# Patient Record
Sex: Female | Born: 1960 | Race: White | Hispanic: No | Smoking: Former smoker
Health system: Southern US, Community
[De-identification: ages and names within clinical notes are randomized; demographics above are authoritative.]

## PROBLEM LIST (undated history)

## (undated) DIAGNOSIS — S62101A Fracture of unspecified carpal bone, right wrist, initial encounter for closed fracture: Secondary | ICD-10-CM

## (undated) HISTORY — PX: BILATERAL CARPAL TUNNEL RELEASE: SHX6508

## (undated) HISTORY — PX: KNEE ARTHROSCOPY: SHX127

## (undated) HISTORY — PX: DILATION AND CURETTAGE OF UTERUS: SHX78

---

## 2013-07-01 ENCOUNTER — Emergency Department: Payer: Self-pay | Admitting: Emergency Medicine

## 2013-07-01 LAB — COMPREHENSIVE METABOLIC PANEL
Albumin: 3.5 g/dL (ref 3.4–5.0)
Anion Gap: 5 — ABNORMAL LOW (ref 7–16)
BUN: 12 mg/dL (ref 7–18)
Calcium, Total: 8.7 mg/dL (ref 8.5–10.1)
Co2: 28 mmol/L (ref 21–32)
Creatinine: 0.67 mg/dL (ref 0.60–1.30)
EGFR (African American): >60
EGFR (Non-African Amer.): >60
Osmolality: 273 (ref 275–301)
Total Protein: 6.9 g/dL (ref 6.4–8.2)

## 2013-07-01 LAB — CBC
HCT: 38.7 % (ref 35.0–47.0)
MCHC: 35.3 g/dL (ref 32.0–36.0)
MCV: 92 fL (ref 80–100)
RBC: 4.2 10*6/uL (ref 3.80–5.20)
RDW: 12.5 % (ref 11.5–14.5)
WBC: 4.6 10*3/uL (ref 3.6–11.0)

## 2015-03-18 IMAGING — CR DG CHEST 2V
1 series · 2 of 2 positions shown · non-contrast
Comparison: none

REASON FOR EXAM: Cold chills, abnormal previous CXR
COMMENTS:

[Series 1: pa · 0.17mm/px · 2 of 2 slices shown]
[im 1/2]
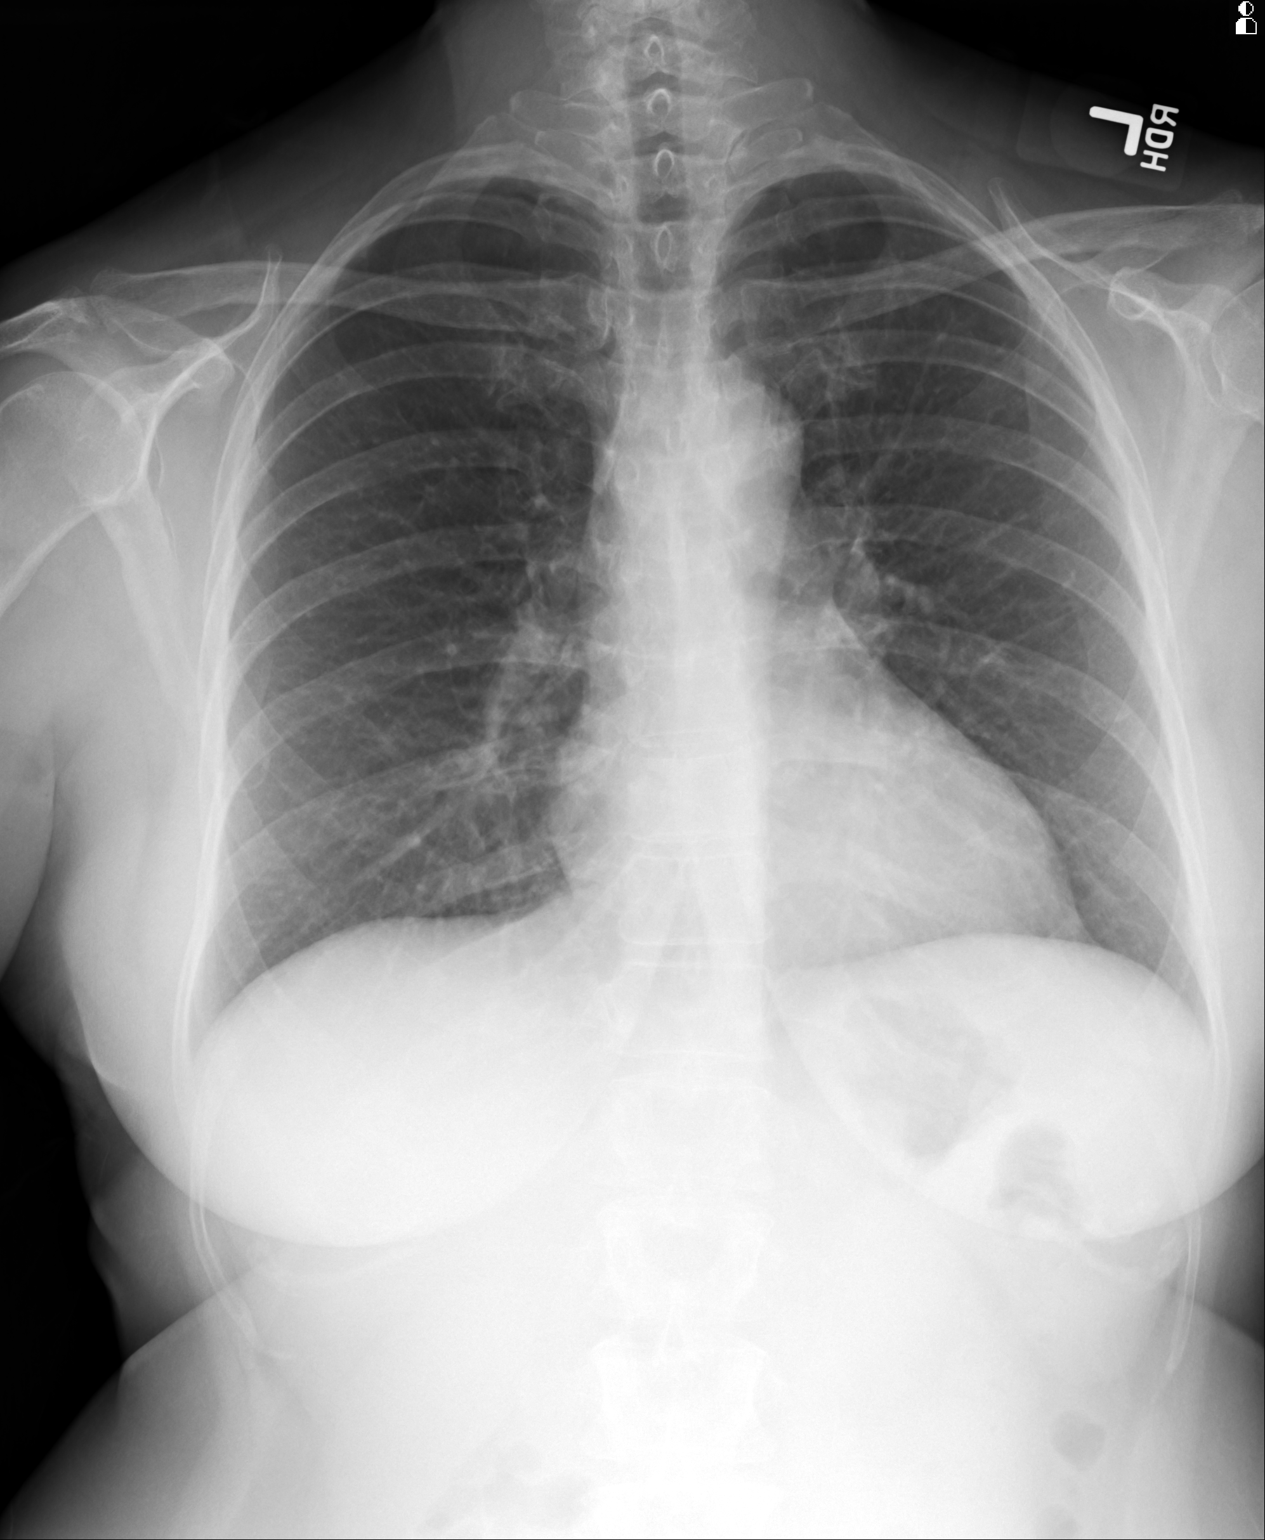
[im 2/2]
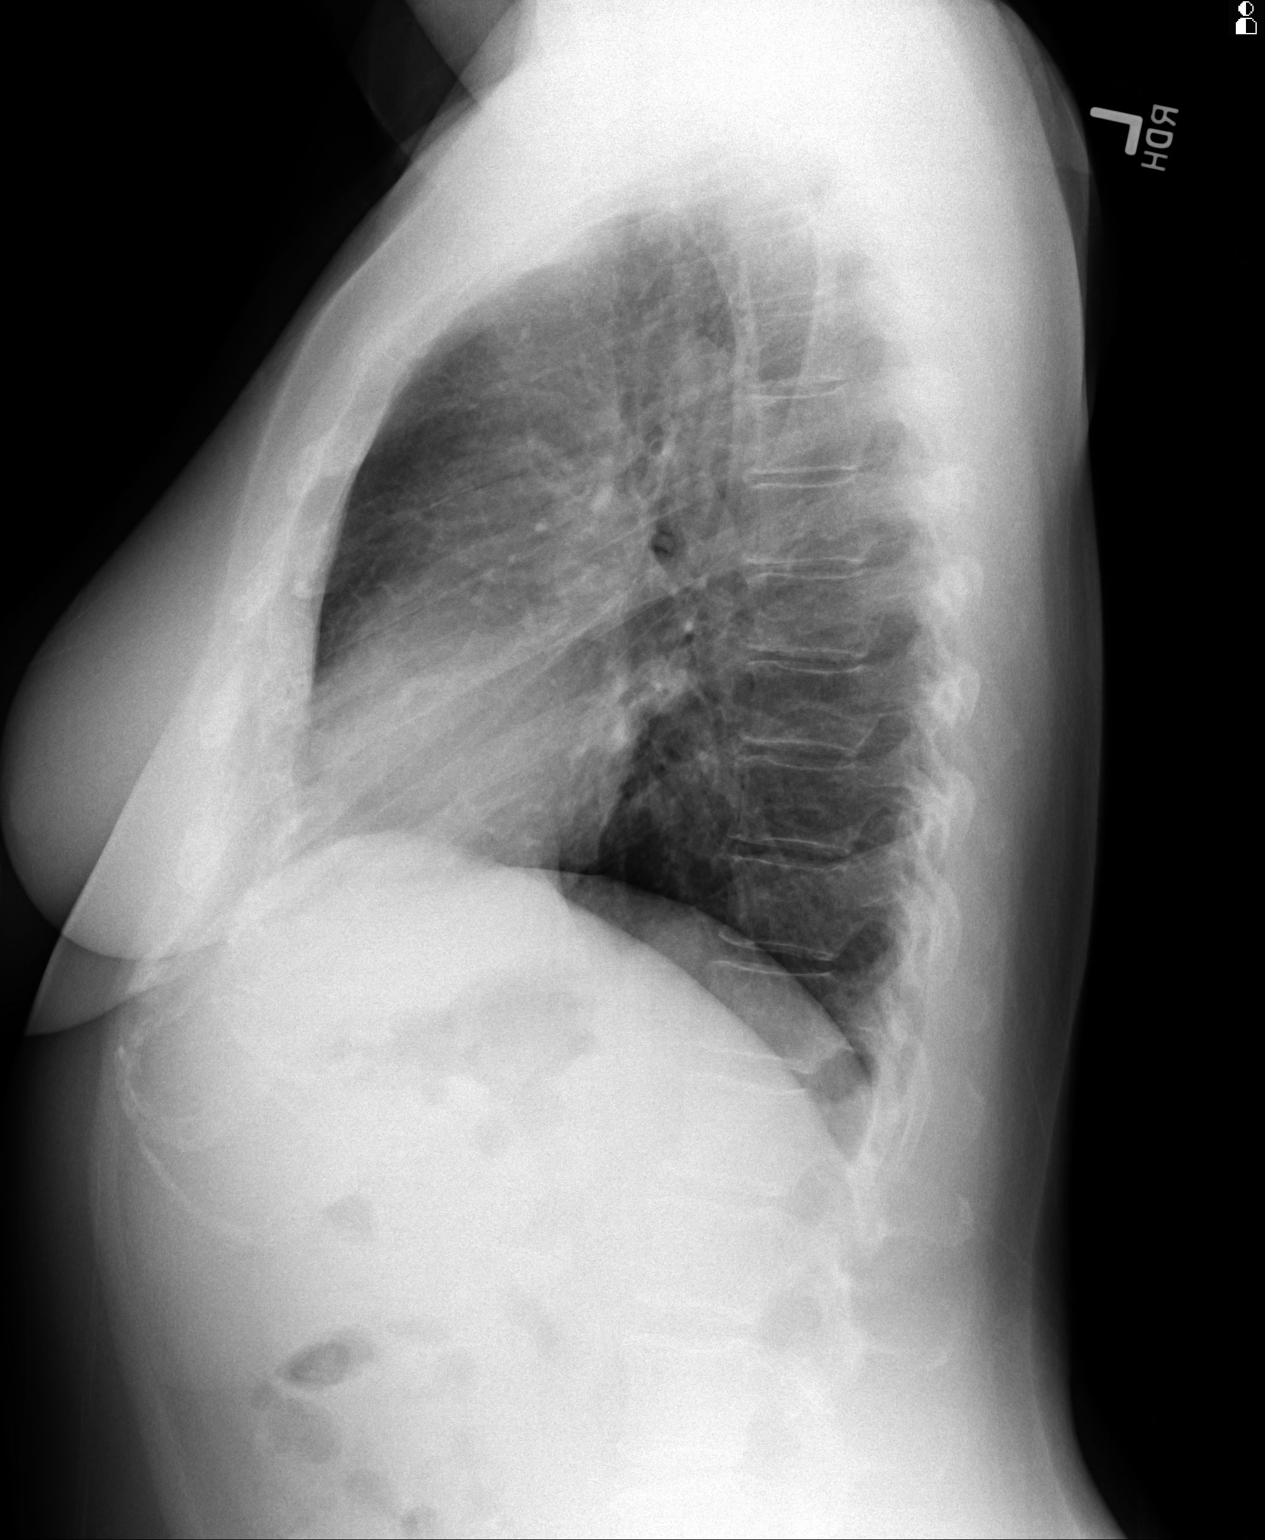

[2 of 2 positions shown; findings below may reference images not displayed]

PROCEDURE:     DXR - DXR CHEST PA (OR AP) AND LATERAL  - July 01, 2013  [DATE]

RESULT:     The lungs are adequately inflated and clear. The cardiac
silhouette is normal in size. There is no pulmonary vascular congestion. The
mediastinum is normal in width. There is no pleural effusion or
pneumothorax. The bony thorax exhibits no acute abnormality.
IMPRESSION: There is no evidence of pneumonia nor other acute
cardiopulmonary abnormality.

[REDACTED]

## 2015-06-04 ENCOUNTER — Encounter (HOSPITAL_BASED_OUTPATIENT_CLINIC_OR_DEPARTMENT_OTHER): Payer: Self-pay | Admitting: *Deleted

## 2015-06-04 NOTE — H&P (Signed)
  PREOPERATIVE H&P  Chief Complaint: UNSPECIFIED FRACTURE OF THE LOWER END OF UNSPECIFIED RADIUS INITIAL ENCOUNTER FOR CLOSED FRACTURE   HPI: Pamela York is a 54 y.o. female who presents for preoperative history and physical with a diagnosis of UNSPECIFIED FRACTURE OF THE LOWER END OF UNSPECIFIED RADIUS INITIAL ENCOUNTER FOR CLOSED FRACTURE . Symptoms are rated as moderate to severe, and have been worsening.  This is significantly impairing activities of daily living.  She has elected for surgical management.   Past Medical History  Diagnosis Date  . Wrist fracture, right    Past Surgical History  Procedure Laterality Date  . Dilation and curettage of uterus    . Bilateral carpal tunnel release Bilateral   . Knee arthroscopy Right    Social History   Social History  . Marital Status: Divorced    Spouse Name: N/A  . Number of Children: N/A  . Years of Education: N/A   Social History Main Topics  . Smoking status: Former Games developer  . Smokeless tobacco: None  . Alcohol Use: Yes     Comment: social  . Drug Use: No  . Sexual Activity: Not Asked   Other Topics Concern  . None   Social History Narrative  . None   History reviewed. No pertinent family history. Allergies  Allergen Reactions  . Penicillins Rash   Prior to Admission medications   Medication Sig Start Date End Date Taking? Authorizing Provider  HYDROcodone-acetaminophen (NORCO/VICODIN) 5-325 MG per tablet Take 1 tablet by mouth every 6 (six) hours as needed for moderate pain.   Yes Historical Provider, MD     Positive ROS: All other systems have been reviewed and were otherwise negative with the exception of those mentioned in the HPI and as above.  Physical Exam: General: Alert, no acute distress Cardiovascular: No pedal edema Respiratory: No cyanosis, no use of accessory musculature GI: No organomegaly, abdomen is soft and non-tender Skin: No lesions in the area of chief complaint Neurologic:  Sensation intact distally Psychiatric: Patient is competent for consent with normal mood and affect Lymphatic: No axillary or cervical lymphadenopathy  MUSCULOSKELETAL: Right wrist is swollen but no erythema or warmth.  Tender to palpation over the distal radius fracture site.  Limited ROM due to pain.  Sensation intact with 2+ distal pulses.   Assessment: UNSPECIFIED FRACTURE OF THE LOWER END OF UNSPECIFIED RADIUS INITIAL ENCOUNTER FOR CLOSED FRACTURE   Plan: Plan for Procedure(s): OPEN REDUCTION INTERNAL FIXATION (ORIF) RIGHT WRIST   The risks benefits and alternatives were discussed with the patient including but not limited to the risks of nonoperative treatment, versus surgical intervention including infection, bleeding, nerve injury,  blood clots, cardiopulmonary complications, morbidity, mortality, among others, and they were willing to proceed.   Lynann Bologna, PA-C  06/04/2015 5:29 PM

## 2015-06-06 ENCOUNTER — Encounter (HOSPITAL_BASED_OUTPATIENT_CLINIC_OR_DEPARTMENT_OTHER): Payer: Self-pay | Admitting: *Deleted

## 2015-06-06 ENCOUNTER — Ambulatory Visit (HOSPITAL_BASED_OUTPATIENT_CLINIC_OR_DEPARTMENT_OTHER): Payer: BLUE CROSS/BLUE SHIELD | Admitting: Anesthesiology

## 2015-06-06 ENCOUNTER — Encounter (HOSPITAL_BASED_OUTPATIENT_CLINIC_OR_DEPARTMENT_OTHER)
Admission: RE | Disposition: A | Discharge status: Home / Self Care | Payer: Self-pay | Source: Ambulatory Visit | Attending: Orthopedic Surgery

## 2015-06-06 ENCOUNTER — Ambulatory Visit (HOSPITAL_BASED_OUTPATIENT_CLINIC_OR_DEPARTMENT_OTHER)
Admission: RE | Admit: 2015-06-06 | Discharge: 2015-06-06 | Disposition: A | Discharge status: Home / Self Care | Payer: BLUE CROSS/BLUE SHIELD | Source: Ambulatory Visit | Attending: Orthopedic Surgery | Admitting: Orthopedic Surgery

## 2015-06-06 DIAGNOSIS — Z87891 Personal history of nicotine dependence: Secondary | ICD-10-CM | POA: Insufficient documentation

## 2015-06-06 DIAGNOSIS — Z880 Allergy status to penicillin: Secondary | ICD-10-CM | POA: Diagnosis not present

## 2015-06-06 DIAGNOSIS — Y9289 Other specified places as the place of occurrence of the external cause: Secondary | ICD-10-CM | POA: Insufficient documentation

## 2015-06-06 DIAGNOSIS — X58XXXA Exposure to other specified factors, initial encounter: Secondary | ICD-10-CM | POA: Diagnosis not present

## 2015-06-06 DIAGNOSIS — S52591A Other fractures of lower end of right radius, initial encounter for closed fracture: Secondary | ICD-10-CM | POA: Diagnosis not present

## 2015-06-06 DIAGNOSIS — Y9389 Activity, other specified: Secondary | ICD-10-CM | POA: Diagnosis not present

## 2015-06-06 DIAGNOSIS — Y998 Other external cause status: Secondary | ICD-10-CM | POA: Diagnosis not present

## 2015-06-06 HISTORY — PX: OPEN REDUCTION INTERNAL FIXATION (ORIF) DISTAL RADIAL FRACTURE: SHX5989

## 2015-06-06 HISTORY — DX: Fracture of unspecified carpal bone, right wrist, initial encounter for closed fracture: S62.101A

## 2015-06-06 SURGERY — OPEN REDUCTION INTERNAL FIXATION (ORIF) DISTAL RADIUS FRACTURE
Anesthesia: Regional | Site: Wrist | Laterality: Right

## 2015-06-06 MED ORDER — DEXAMETHASONE SODIUM PHOSPHATE 10 MG/ML IJ SOLN
INTRAMUSCULAR | Status: AC
  Filled 2015-06-06: qty 1

## 2015-06-06 MED ORDER — LIDOCAINE HCL (CARDIAC) 20 MG/ML IV SOLN
INTRAVENOUS | Status: AC
  Filled 2015-06-06: qty 5

## 2015-06-06 MED ORDER — FENTANYL CITRATE (PF) 100 MCG/2ML IJ SOLN
INTRAMUSCULAR | Status: AC
  Filled 2015-06-06: qty 2

## 2015-06-06 MED ORDER — DOCUSATE SODIUM 100 MG PO CAPS: 100.0000 mg | ORAL_CAPSULE | Freq: Two times a day (BID) | ORAL | Status: AC

## 2015-06-06 MED ORDER — CHLORHEXIDINE GLUCONATE 4 % EX LIQD: 60.0000 mL | Freq: Once | CUTANEOUS | Status: DC

## 2015-06-06 MED ORDER — MIDAZOLAM HCL 2 MG/2ML IJ SOLN
INTRAMUSCULAR | Status: AC
  Filled 2015-06-06: qty 2

## 2015-06-06 MED ORDER — OXYCODONE-ACETAMINOPHEN 5-325 MG PO TABS: 1.0000 | ORAL_TABLET | ORAL | Status: AC | PRN

## 2015-06-06 MED ORDER — PROPOFOL 10 MG/ML IV BOLUS
INTRAVENOUS | Status: DC | PRN
  Administered 2015-06-06: 200 mg via INTRAVENOUS

## 2015-06-06 MED ORDER — GLYCOPYRROLATE 0.2 MG/ML IJ SOLN: 0.2000 mg | Freq: Once | INTRAMUSCULAR | Status: DC | PRN

## 2015-06-06 MED ORDER — PROPOFOL 10 MG/ML IV BOLUS
INTRAVENOUS | Status: AC
  Filled 2015-06-06: qty 20

## 2015-06-06 MED ORDER — SCOPOLAMINE 1 MG/3DAYS TD PT72: 1.0000 | MEDICATED_PATCH | Freq: Once | TRANSDERMAL | Status: DC | PRN

## 2015-06-06 MED ORDER — 0.9 % SODIUM CHLORIDE (POUR BTL) OPTIME
TOPICAL | Status: DC | PRN
  Administered 2015-06-06: 300 mL

## 2015-06-06 MED ORDER — ACETAMINOPHEN 500 MG PO TABS
ORAL_TABLET | ORAL | Status: AC
  Filled 2015-06-06: qty 2

## 2015-06-06 MED ORDER — ACETAMINOPHEN 325 MG PO TABS
ORAL_TABLET | ORAL | Status: AC
  Filled 2015-06-06: qty 2

## 2015-06-06 MED ORDER — LIDOCAINE HCL (CARDIAC) 20 MG/ML IV SOLN
INTRAVENOUS | Status: DC | PRN
Start: 2015-06-06 — End: 2015-06-06
  Administered 2015-06-06: 70 mg via INTRAVENOUS

## 2015-06-06 MED ORDER — CEFAZOLIN SODIUM-DEXTROSE 2-3 GM-% IV SOLR
2.0000 g | INTRAVENOUS | Status: AC
  Administered 2015-06-06: 2 g via INTRAVENOUS

## 2015-06-06 MED ORDER — ACETAMINOPHEN 325 MG PO TABS
650.0000 mg | ORAL_TABLET | Freq: Once | ORAL | Status: AC
  Administered 2015-06-06: 650 mg via ORAL

## 2015-06-06 MED ORDER — OXYCODONE HCL 5 MG/5ML PO SOLN: 5.0000 mg | Freq: Once | ORAL | Status: DC | PRN

## 2015-06-06 MED ORDER — OXYCODONE HCL 5 MG PO TABS: 5.0000 mg | ORAL_TABLET | Freq: Once | ORAL | Status: DC | PRN

## 2015-06-06 MED ORDER — DEXAMETHASONE SODIUM PHOSPHATE 4 MG/ML IJ SOLN
INTRAMUSCULAR | Status: DC | PRN
  Administered 2015-06-06: 10 mg via INTRAVENOUS

## 2015-06-06 MED ORDER — POTASSIUM CHLORIDE IN NACL 20-0.45 MEQ/L-% IV SOLN
INTRAVENOUS | Status: DC
Start: 2015-06-06 — End: 2015-06-06

## 2015-06-06 MED ORDER — ONDANSETRON HCL 4 MG PO TABS: 4.0000 mg | ORAL_TABLET | Freq: Three times a day (TID) | ORAL | Status: AC | PRN

## 2015-06-06 MED ORDER — FENTANYL CITRATE (PF) 100 MCG/2ML IJ SOLN
INTRAMUSCULAR | Status: AC
  Filled 2015-06-06: qty 4

## 2015-06-06 MED ORDER — FENTANYL CITRATE (PF) 100 MCG/2ML IJ SOLN
50.0000 ug | INTRAMUSCULAR | Status: AC | PRN
  Administered 2015-06-06 (×2): 100 ug via INTRAVENOUS
  Administered 2015-06-06: 25 ug via INTRAVENOUS

## 2015-06-06 MED ORDER — CEFAZOLIN SODIUM-DEXTROSE 2-3 GM-% IV SOLR
INTRAVENOUS | Status: AC
  Filled 2015-06-06: qty 50

## 2015-06-06 MED ORDER — LACTATED RINGERS IV SOLN
INTRAVENOUS | Status: DC
  Administered 2015-06-06: 13:00:00 via INTRAVENOUS

## 2015-06-06 MED ORDER — HYDROMORPHONE HCL 1 MG/ML IJ SOLN: 0.2500 mg | INTRAMUSCULAR | Status: DC | PRN

## 2015-06-06 MED ORDER — FENTANYL CITRATE (PF) 100 MCG/2ML IJ SOLN
INTRAMUSCULAR | Status: AC
Start: 2015-06-06 — End: 2015-06-06
  Filled 2015-06-06: qty 4

## 2015-06-06 MED ORDER — MEPERIDINE HCL 25 MG/ML IJ SOLN: 6.2500 mg | INTRAMUSCULAR | Status: DC | PRN

## 2015-06-06 MED ORDER — ONDANSETRON HCL 4 MG/2ML IJ SOLN
INTRAMUSCULAR | Status: DC | PRN
  Administered 2015-06-06: 4 mg via INTRAVENOUS

## 2015-06-06 MED ORDER — MIDAZOLAM HCL 2 MG/2ML IJ SOLN
1.0000 mg | INTRAMUSCULAR | Status: DC | PRN
  Administered 2015-06-06: 2 mg via INTRAVENOUS

## 2015-06-06 MED ORDER — ACETAMINOPHEN 500 MG PO TABS
1000.0000 mg | ORAL_TABLET | Freq: Once | ORAL | Status: AC
  Administered 2015-06-06: 1000 mg via ORAL

## 2015-06-06 MED ORDER — ONDANSETRON HCL 4 MG/2ML IJ SOLN
INTRAMUSCULAR | Status: AC
  Filled 2015-06-06: qty 2

## 2015-06-06 SURGICAL SUPPLY — 71 items
BANDAGE ELASTIC 4 VELCRO ST LF (GAUZE/BANDAGES/DRESSINGS) ×2 IMPLANT
BIT DRILL 2.2 SS TIBIAL (BIT) ×2 IMPLANT
BLADE SURG 15 STRL LF DISP TIS (BLADE) ×2 IMPLANT
BLADE SURG 15 STRL SS (BLADE) ×2
BNDG ESMARK 4X9 LF (GAUZE/BANDAGES/DRESSINGS) ×2 IMPLANT
CHLORAPREP W/TINT 26ML (MISCELLANEOUS) ×2 IMPLANT
CLSR STERI-STRIP ANTIMIC 1/2X4 (GAUZE/BANDAGES/DRESSINGS) ×2 IMPLANT
CORDS BIPOLAR (ELECTRODE) ×2 IMPLANT
COVER BACK TABLE 60X90IN (DRAPES) ×2 IMPLANT
CUFF TOURNIQUET SINGLE 18IN (TOURNIQUET CUFF) ×2 IMPLANT
CUFF TOURNIQUET SINGLE 24IN (TOURNIQUET CUFF) IMPLANT
DECANTER SPIKE VIAL GLASS SM (MISCELLANEOUS) IMPLANT
DRAPE EXTREMITY T 121X128X90 (DRAPE) ×2 IMPLANT
DRAPE OEC MINIVIEW 54X84 (DRAPES) ×2 IMPLANT
DRAPE SURG 17X23 STRL (DRAPES) IMPLANT
DRAPE U 20/CS (DRAPES) ×2 IMPLANT
DRSG EMULSION OIL 3X3 NADH (GAUZE/BANDAGES/DRESSINGS) ×2 IMPLANT
GAUZE SPONGE 4X4 12PLY STRL (GAUZE/BANDAGES/DRESSINGS) ×2 IMPLANT
GAUZE XEROFORM 1X8 LF (GAUZE/BANDAGES/DRESSINGS) IMPLANT
GLOVE BIO SURGEON STRL SZ7 (GLOVE) ×2 IMPLANT
GLOVE BIO SURGEON STRL SZ7.5 (GLOVE) ×2 IMPLANT
GLOVE BIO SURGEON STRL SZ8 (GLOVE) ×2 IMPLANT
GLOVE BIOGEL PI IND STRL 7.0 (GLOVE) ×2 IMPLANT
GLOVE BIOGEL PI IND STRL 8 (GLOVE) ×2 IMPLANT
GLOVE BIOGEL PI INDICATOR 7.0 (GLOVE) ×2
GLOVE BIOGEL PI INDICATOR 8 (GLOVE) ×2
GLOVE ECLIPSE 6.5 STRL STRAW (GLOVE) ×2 IMPLANT
GLOVE EXAM NITRILE MD LF STRL (GLOVE) ×2 IMPLANT
GOWN STRL REUS W/ TWL LRG LVL3 (GOWN DISPOSABLE) ×1 IMPLANT
GOWN STRL REUS W/ TWL XL LVL3 (GOWN DISPOSABLE) ×2 IMPLANT
GOWN STRL REUS W/TWL LRG LVL3 (GOWN DISPOSABLE) ×1
GOWN STRL REUS W/TWL XL LVL3 (GOWN DISPOSABLE) ×2
K-WIRE 1.6 (WIRE) ×2
K-WIRE FX5X1.6XNS BN SS (WIRE) ×2
KWIRE FX5X1.6XNS BN SS (WIRE) ×2 IMPLANT
NEEDLE HYPO 25X1 1.5 SAFETY (NEEDLE) IMPLANT
NS IRRIG 1000ML POUR BTL (IV SOLUTION) ×2 IMPLANT
PACK BASIN DAY SURGERY FS (CUSTOM PROCEDURE TRAY) ×2 IMPLANT
PAD CAST 4YDX4 CTTN HI CHSV (CAST SUPPLIES) ×1 IMPLANT
PADDING CAST ABS 4INX4YD NS (CAST SUPPLIES)
PADDING CAST ABS COTTON 4X4 ST (CAST SUPPLIES) IMPLANT
PADDING CAST COTTON 4X4 STRL (CAST SUPPLIES) ×1
PEG LOCKING SMOOTH 2.2X16 (Screw) ×6 IMPLANT
PEG LOCKING SMOOTH 2.2X18 (Peg) ×4 IMPLANT
PEG LOCKING SMOOTH 2.2X20 (Screw) ×4 IMPLANT
PLATE NARROW DVR RIGHT (Plate) ×2 IMPLANT
SCREW LOCK 14X2.7X 3 LD TPR (Screw) ×1 IMPLANT
SCREW LOCKING 2.7X13MM (Screw) ×2 IMPLANT
SCREW LOCKING 2.7X14 (Screw) ×1 IMPLANT
SLEEVE SCD COMPRESS KNEE MED (MISCELLANEOUS) ×2 IMPLANT
SPLINT FAST PLASTER 5X30 (CAST SUPPLIES)
SPLINT PLASTER CAST FAST 5X30 (CAST SUPPLIES) IMPLANT
SPLINT PLASTER CAST XFAST 3X15 (CAST SUPPLIES) ×10 IMPLANT
SPLINT PLASTER CAST XFAST 4X15 (CAST SUPPLIES) IMPLANT
SPLINT PLASTER XTRA FAST SET 4 (CAST SUPPLIES)
SPLINT PLASTER XTRA FASTSET 3X (CAST SUPPLIES) ×10
SUCTION FRAZIER TIP 10 FR DISP (SUCTIONS) IMPLANT
SUT ETHILON 3 0 PS 1 (SUTURE) IMPLANT
SUT MON AB 2-0 CT1 36 (SUTURE) ×2 IMPLANT
SUT MON AB 4-0 PC3 18 (SUTURE) IMPLANT
SUT PROLENE 3 0 PS 2 (SUTURE) IMPLANT
SUT VIC AB 0 SH 27 (SUTURE) IMPLANT
SUT VIC AB 2-0 SH 27 (SUTURE)
SUT VIC AB 2-0 SH 27XBRD (SUTURE) IMPLANT
SUT VIC AB 3-0 FS2 27 (SUTURE) IMPLANT
SYR BULB 3OZ (MISCELLANEOUS) ×2 IMPLANT
SYR CONTROL 10ML LL (SYRINGE) IMPLANT
TOWEL OR 17X24 6PK STRL BLUE (TOWEL DISPOSABLE) ×2 IMPLANT
TOWEL OR NON WOVEN STRL DISP B (DISPOSABLE) ×2 IMPLANT
TUBE CONNECTING 20X1/4 (TUBING) IMPLANT
UNDERPAD 30X30 (UNDERPADS AND DIAPERS) ×2 IMPLANT

## 2015-06-06 NOTE — Transfer of Care (Signed)
Immediate Anesthesia Transfer of Care Note  Patient: Pamela York  Procedure(s) Performed: Procedure(s): OPEN REDUCTION INTERNAL FIXATION (ORIF) RIGHT WRIST  (Right)  Patient Location: PACU  Anesthesia Type:General  Level of Consciousness: awake and sedated  Airway & Oxygen Therapy: Patient Spontanous Breathing and Patient connected to face mask oxygen  Post-op Assessment: Report given to RN and Post -op Vital signs reviewed and stable  Post vital signs: Reviewed and stable  Last Vitals:  Filed Vitals:   06/06/15 1323  BP:   Pulse: 76  Temp:   Resp: 14    Complications: No apparent anesthesia complications

## 2015-06-06 NOTE — Discharge Instructions (Signed)
Keep splint clean and dry till follow up  Non-weight bearing in the right arm  Regional Anesthesia Blocks  1. Numbness or the inability to move the "blocked" extremity may last from 3-48 hours after placement. The length of time depends on the medication injected and your individual response to the medication. If the numbness is not going away after 48 hours, call your surgeon.  2. The extremity that is blocked will need to be protected until the numbness is gone and the  Strength has returned. Because you cannot feel it, you will need to take extra care to avoid injury. Because it may be weak, you may have difficulty moving it or using it. You may not know what position it is in without looking at it while the block is in effect.  3. For blocks in the legs and feet, returning to weight bearing and walking needs to be done carefully. You will need to wait until the numbness is entirely gone and the strength has returned. You should be able to move your leg and foot normally before you try and bear weight or walk. You will need someone to be with you when you first try to ensure you do not fall and possibly risk injury.  4. Bruising and tenderness at the needle site are common side effects and will resolve in a few days.  5. Persistent numbness or new problems with movement should be communicated to the surgeon or the [facility] [phone] [facility] [phone]).  Post Anesthesia Home Care Instructions  Activity: Get plenty of rest for the remainder of the day. A responsible adult should stay with you for 24 hours following the procedure.  For the next 24 hours, DO NOT: -Drive a car -Advertising copywriter -Drink alcoholic beverages -Take any medication unless instructed by your physician -Make any legal decisions or sign important papers.  Meals: Start with liquid foods such as gelatin or soup. Progress to regular foods as tolerated. Avoid greasy, spicy,  heavy foods. If nausea and/or vomiting occur, drink only clear liquids until the nausea and/or vomiting subsides. Call your physician if vomiting continues.  Special Instructions/Symptoms: Your throat may feel dry or sore from the anesthesia or the breathing tube placed in your throat during surgery. If this causes discomfort, gargle with warm salt water. The discomfort should disappear within 24 hours.  If you had a scopolamine patch placed behind your ear for the management of post- operative nausea and/or vomiting:  1. The medication in the patch is effective for 72 hours, after which it should be removed.  Wrap patch in a tissue and discard in the trash. Wash hands thoroughly with soap and water. 2. You may remove the patch earlier than 72 hours if you experience unpleasant side effects which may include dry mouth, dizziness or visual disturbances. 3. Avoid touching the patch. Wash your hands with soap and water after contact with the patch.

## 2015-06-06 NOTE — Progress Notes (Signed)
Assisted Dr. Crews with right, ultrasound guided, supraclavicular block. Side rails up, monitors on throughout procedure. See vital signs in flow sheet. Tolerated Procedure well. 

## 2015-06-06 NOTE — Interval H&P Note (Signed)
History and Physical Interval Note:  06/06/2015 7:22 AM  Pamela York  has presented today for surgery, with the diagnosis of UNSPECIFIED FRACTURE OF THE LOWER END OF UNSPECIFIED RADIUS INITIAL ENCOUNTER FOR CLOSED FRACTURE   The various methods of treatment have been discussed with the patient and family. After consideration of risks, benefits and other options for treatment, the patient has consented to  Procedure(s): OPEN REDUCTION INTERNAL FIXATION (ORIF) RIGHT WRIST  (Right) as a surgical intervention .  The patient's history has been reviewed, patient examined, no change in status, stable for surgery.  I have reviewed the patient's chart and labs.  Questions were answered to the patient's satisfaction.     Amorie Rentz D

## 2015-06-06 NOTE — Op Note (Signed)
06/06/2015  2:56 PM  PATIENT:  Pamela York    PRE-OPERATIVE DIAGNOSIS:  UNSPECIFIED FRACTURE OF THE LOWER END OF UNSPECIFIED RADIUS INITIAL ENCOUNTER FOR CLOSED FRACTURE   POST-OPERATIVE DIAGNOSIS:  Same  PROCEDURE:  OPEN REDUCTION INTERNAL FIXATION (ORIF) RIGHT WRIST   SURGEON:  MURPHY, Jewel Baize, MD  ASSISTANT: Janalee Dane, PA-C, She was present and scrubbed throughout the case, critical for completion in a timely fashion, and for retraction, instrumentation, and closure.   ANESTHESIA:   gen   PREOPERATIVE INDICATIONS:  Pamela York is a  54 y.o. female with a diagnosis of UNSPECIFIED FRACTURE OF THE LOWER END OF UNSPECIFIED RADIUS INITIAL ENCOUNTER FOR CLOSED FRACTURE  who failed conservative measures and elected for surgical management.    The risks benefits and alternatives were discussed with the patient preoperatively including but not limited to the risks of infection, bleeding, nerve injury, cardiopulmonary complications, the need for revision surgery, among others, and the patient was willing to proceed.  OPERATIVE IMPLANTS: DVR plate  OPERATIVE FINDINGS: volar barton with 3 part articular split  BLOOD LOSS: min  COMPLICATIONS: none  TOURNIQUET TIME:  OPERATIVE PROCEDURE:  Patient was identified in the preoperative holding area and site was marked by me She was transported to the operating theater and placed on the table in supine position taking care to pad all bony prominences. After a preincinduction time out anesthesia was induced. The right upper extremity was prepped and draped in normal sterile fashion and a pre-incision timeout was performed. She received ancef for preoperative antibiotics.   I made a 5 cm incision centered over her FCR tendon and dissected down carefully to the level of the flexor tendon sheath and incise this longitudinally and retracted the FCR radially and incised the dorsal aspect of the sheath.   I bluntly dissected the FPL  muscle belly away from the brachioradialis and then sharply incised the pronator tendon from the distal radius and from the wrist capsule. I Elevated this off the bone the fractures visible.   I released the brachioradialis from its insertion. I then debrided the fracture and performed a manual reduction.   Prior to placing the articular pegs I did place one shaft screw to help buttress the volar Laurence Compton piece back into place took multiple x-rays and was happy with that.  I selected a plate and I placed it on the bone. I pinned it into place and was happy on multiple radiographic views with it's placement. I then fixed the plate distally with the locking pegs. I confirmed no articular penetration with the pegs and that none were prominent dorsally.   I was happy with the final fluoro xrays    I thoroughly irrigated the wound and closed the pronator over top of the plate and then closed the skin in layers with absorbable stitch. Sterile dressing was applied using the PACU in stable condition.  POST OPERATIVE PLAN: NWB, Splint full time. Ambulate for DVT px.    This note was generated using a template and dragon dictation system. In light of that, I have reviewed the note and all aspects of it are applicable to this case. Any dictation errors are due to the computerized dictation system.

## 2015-06-06 NOTE — Anesthesia Procedure Notes (Addendum)
Anesthesia Regional Block:  Supraclavicular block  Pre-Anesthetic Checklist: ,, timeout performed, Correct Patient, Correct Site, Correct Laterality, Correct Procedure, Correct Position, site marked, Risks and benefits discussed,  Surgical consent,  Pre-op evaluation,  At surgeon's request and post-op pain management  Laterality: Right and Upper  Prep: chloraprep       Needles:  Injection technique: Single-shot  Needle Type: Echogenic Stimulator Needle     Needle Length: 5cm 5 cm Needle Gauge: 21 and 21 G    Additional Needles:  Procedures: ultrasound guided (picture in chart) Supraclavicular block Narrative:  Start time: 06/06/2015 1:20 PM End time: 06/06/2015 1:25 PM Injection made incrementally with aspirations every 5 mL.  Performed by: Personally  Anesthesiologist: CREWS, DAVID   Procedure Name: LMA Insertion Performed by: York Grice Pre-anesthesia Checklist: Patient identified, Timeout performed, Emergency Drugs available, Suction available and Patient being monitored Patient Re-evaluated:Patient Re-evaluated prior to inductionOxygen Delivery Method: Circle system utilized Preoxygenation: Pre-oxygenation with 100% oxygen Intubation Type: IV induction Ventilation: Mask ventilation without difficulty LMA: LMA inserted LMA Size: 4.0 Number of attempts: 1 Placement Confirmation: positive ETCO2 and breath sounds checked- equal and bilateral Tube secured with: Tape Dental Injury: Teeth and Oropharynx as per pre-operative assessment

## 2015-06-06 NOTE — Anesthesia Postprocedure Evaluation (Signed)
  Anesthesia Post-op Note  Patient: Pamela York  Procedure(s) Performed: Procedure(s): OPEN REDUCTION INTERNAL FIXATION (ORIF) RIGHT WRIST  (Right)  Patient Location: PACU  Anesthesia Type: General, Regional   Level of Consciousness: awake, alert  and oriented  Airway and Oxygen Therapy: Patient Spontanous Breathing  Post-op Pain: none  Post-op Assessment: Post-op Vital signs reviewed  Post-op Vital Signs: Reviewed  Last Vitals:  Filed Vitals:   06/06/15 1602  BP:   Pulse: 61  Temp:   Resp: 16    Complications: No apparent anesthesia complications

## 2015-06-06 NOTE — Anesthesia Preprocedure Evaluation (Signed)
Anesthesia Evaluation  Patient identified by MRN, date of birth, ID band Patient awake    Reviewed: Allergy & Precautions, NPO status , Patient's Chart, lab work & pertinent test results  Airway Mallampati: I  TM Distance: >3 FB Neck ROM: Full    Dental  (+) Teeth Intact, Dental Advisory Given   Pulmonary former smoker,    breath sounds clear to auscultation       Cardiovascular  Rhythm:Regular Rate:Normal     Neuro/Psych    GI/Hepatic   Endo/Other    Renal/GU      Musculoskeletal   Abdominal   Peds  Hematology   Anesthesia Other Findings   Reproductive/Obstetrics                             Anesthesia Physical Anesthesia Plan  ASA: I  Anesthesia Plan: General and Regional   Post-op Pain Management:    Induction: Intravenous  Airway Management Planned: LMA  Additional Equipment:   Intra-op Plan:   Post-operative Plan: Extubation in OR  Informed Consent: I have reviewed the patients History and Physical, chart, labs and discussed the procedure including the risks, benefits and alternatives for the proposed anesthesia with the patient or authorized representative who has indicated his/her understanding and acceptance.   Dental advisory given  Plan Discussed with: CRNA, Anesthesiologist and Surgeon  Anesthesia Plan Comments:         Anesthesia Quick Evaluation

## 2015-06-06 NOTE — Interval H&P Note (Signed)
History and Physical Interval Note:  06/06/2015 12:34 PM  Pamela York  has presented today for surgery, with the diagnosis of UNSPECIFIED FRACTURE OF THE LOWER END OF UNSPECIFIED RADIUS INITIAL ENCOUNTER FOR CLOSED FRACTURE   The various methods of treatment have been discussed with the patient and family. After consideration of risks, benefits and other options for treatment, the patient has consented to  Procedure(s): OPEN REDUCTION INTERNAL FIXATION (ORIF) RIGHT WRIST  (Right) as a surgical intervention .  The patient's history has been reviewed, patient examined, no change in status, stable for surgery.  I have reviewed the patient's chart and labs.  Questions were answered to the patient's satisfaction.     Avital Dancy D

## 2015-06-09 ENCOUNTER — Encounter (HOSPITAL_BASED_OUTPATIENT_CLINIC_OR_DEPARTMENT_OTHER): Payer: Self-pay | Admitting: Orthopedic Surgery
